# Patient Record
Sex: Female | Born: 1970 | Hispanic: Yes | Smoking: Never smoker
Health system: Southern US, Community
[De-identification: ages and names within clinical notes are randomized; demographics above are authoritative.]

## PROBLEM LIST (undated history)

## (undated) DIAGNOSIS — N760 Acute vaginitis: Secondary | ICD-10-CM

## (undated) DIAGNOSIS — R87629 Unspecified abnormal cytological findings in specimens from vagina: Secondary | ICD-10-CM

## (undated) DIAGNOSIS — I10 Essential (primary) hypertension: Secondary | ICD-10-CM

## (undated) DIAGNOSIS — E7800 Pure hypercholesterolemia, unspecified: Secondary | ICD-10-CM

## (undated) DIAGNOSIS — E039 Hypothyroidism, unspecified: Secondary | ICD-10-CM

## (undated) DIAGNOSIS — R112 Nausea with vomiting, unspecified: Secondary | ICD-10-CM

## (undated) DIAGNOSIS — B9689 Other specified bacterial agents as the cause of diseases classified elsewhere: Secondary | ICD-10-CM

## (undated) DIAGNOSIS — Z9889 Other specified postprocedural states: Secondary | ICD-10-CM

## (undated) DIAGNOSIS — E079 Disorder of thyroid, unspecified: Secondary | ICD-10-CM

## (undated) DIAGNOSIS — N72 Inflammatory disease of cervix uteri: Secondary | ICD-10-CM

## (undated) DIAGNOSIS — D241 Benign neoplasm of right breast: Secondary | ICD-10-CM

## (undated) HISTORY — DX: Other specified bacterial agents as the cause of diseases classified elsewhere: B96.89

## (undated) HISTORY — DX: Disorder of thyroid, unspecified: E07.9

## (undated) HISTORY — DX: Other specified bacterial agents as the cause of diseases classified elsewhere: N76.0

## (undated) HISTORY — DX: Benign neoplasm of right breast: D24.1

## (undated) HISTORY — DX: Inflammatory disease of cervix uteri: N72

## (undated) HISTORY — DX: Unspecified abnormal cytological findings in specimens from vagina: R87.629

## (undated) HISTORY — DX: Pure hypercholesterolemia, unspecified: E78.00

## (undated) HISTORY — DX: Essential (primary) hypertension: I10

---

## 2006-01-10 HISTORY — PX: BREAST BIOPSY: SHX20

## 2011-04-03 ENCOUNTER — Other Ambulatory Visit (HOSPITAL_COMMUNITY): Payer: Self-pay | Admitting: Family Medicine

## 2011-04-03 DIAGNOSIS — N6310 Unspecified lump in the right breast, unspecified quadrant: Secondary | ICD-10-CM

## 2011-04-25 ENCOUNTER — Encounter (HOSPITAL_COMMUNITY): Payer: Self-pay

## 2016-06-10 DIAGNOSIS — R1013 Epigastric pain: Secondary | ICD-10-CM | POA: Diagnosis not present

## 2016-06-10 DIAGNOSIS — F3289 Other specified depressive episodes: Secondary | ICD-10-CM | POA: Diagnosis not present

## 2016-06-10 DIAGNOSIS — F4321 Adjustment disorder with depressed mood: Secondary | ICD-10-CM | POA: Diagnosis not present

## 2016-06-10 DIAGNOSIS — K2900 Acute gastritis without bleeding: Secondary | ICD-10-CM | POA: Diagnosis not present

## 2016-07-23 DIAGNOSIS — K439 Ventral hernia without obstruction or gangrene: Secondary | ICD-10-CM | POA: Diagnosis not present

## 2016-09-21 DIAGNOSIS — N760 Acute vaginitis: Secondary | ICD-10-CM | POA: Diagnosis not present

## 2016-09-21 DIAGNOSIS — N72 Inflammatory disease of cervix uteri: Secondary | ICD-10-CM | POA: Diagnosis not present

## 2016-09-21 DIAGNOSIS — Z01419 Encounter for gynecological examination (general) (routine) without abnormal findings: Secondary | ICD-10-CM | POA: Diagnosis not present

## 2016-09-21 DIAGNOSIS — R87610 Atypical squamous cells of undetermined significance on cytologic smear of cervix (ASC-US): Secondary | ICD-10-CM | POA: Diagnosis not present

## 2016-09-21 DIAGNOSIS — Z3009 Encounter for other general counseling and advice on contraception: Secondary | ICD-10-CM | POA: Diagnosis not present

## 2016-09-26 DIAGNOSIS — E789 Disorder of lipoprotein metabolism, unspecified: Secondary | ICD-10-CM | POA: Diagnosis not present

## 2016-09-26 DIAGNOSIS — Z01419 Encounter for gynecological examination (general) (routine) without abnormal findings: Secondary | ICD-10-CM | POA: Diagnosis not present

## 2016-10-09 DIAGNOSIS — E039 Hypothyroidism, unspecified: Secondary | ICD-10-CM | POA: Diagnosis not present

## 2016-10-09 DIAGNOSIS — E785 Hyperlipidemia, unspecified: Secondary | ICD-10-CM | POA: Diagnosis not present

## 2016-10-19 DIAGNOSIS — Z1231 Encounter for screening mammogram for malignant neoplasm of breast: Secondary | ICD-10-CM | POA: Diagnosis not present

## 2016-11-26 DIAGNOSIS — E039 Hypothyroidism, unspecified: Secondary | ICD-10-CM | POA: Diagnosis not present

## 2016-12-07 HISTORY — PX: COLPOSCOPY: SHX161

## 2016-12-25 ENCOUNTER — Other Ambulatory Visit (HOSPITAL_COMMUNITY)
Admission: RE | Admit: 2016-12-25 | Discharge: 2016-12-25 | Disposition: A | Discharge status: Home / Self Care | Payer: BLUE CROSS/BLUE SHIELD | Source: Ambulatory Visit | Attending: Unknown Physician Specialty | Admitting: Unknown Physician Specialty

## 2016-12-25 DIAGNOSIS — R87610 Atypical squamous cells of undetermined significance on cytologic smear of cervix (ASC-US): Secondary | ICD-10-CM | POA: Diagnosis not present

## 2016-12-25 DIAGNOSIS — N871 Moderate cervical dysplasia: Secondary | ICD-10-CM | POA: Diagnosis not present

## 2016-12-25 DIAGNOSIS — N879 Dysplasia of cervix uteri, unspecified: Secondary | ICD-10-CM | POA: Diagnosis not present

## 2016-12-25 DIAGNOSIS — N72 Inflammatory disease of cervix uteri: Secondary | ICD-10-CM | POA: Diagnosis not present

## 2017-02-11 ENCOUNTER — Encounter: Payer: Self-pay | Admitting: *Deleted

## 2017-02-11 ENCOUNTER — Encounter: Payer: Self-pay | Admitting: Obstetrics and Gynecology

## 2017-02-11 ENCOUNTER — Ambulatory Visit (INDEPENDENT_AMBULATORY_CARE_PROVIDER_SITE_OTHER): Payer: BLUE CROSS/BLUE SHIELD | Admitting: Obstetrics and Gynecology

## 2017-02-11 DIAGNOSIS — Z3202 Encounter for pregnancy test, result negative: Secondary | ICD-10-CM

## 2017-02-11 DIAGNOSIS — N871 Moderate cervical dysplasia: Secondary | ICD-10-CM | POA: Diagnosis not present

## 2017-02-11 LAB — POCT URINE PREGNANCY: Preg Test, Ur: NEGATIVE

## 2017-02-11 NOTE — Progress Notes (Addendum)
   [facility] Visit  @DATE @            Patient name: Brandy Frank MRN [id number]  Date of birth: Nov 22, 1970  CC & HPI:  Brandy Frank is a 46 y.o. 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  ROS:  ROS   Pertinent History Reviewed:   Reviewed: Significant for  Medical         Past Medical History:  Diagnosis Date  . BV (bacterial vaginosis)   . Cervicitis   . Fibroadenoma of right breast   . High cholesterol   . Thyroid disease   . Vaginal Pap smear, abnormal                               Surgical Hx:    Past Surgical History:  Procedure Laterality Date  . BREAST BIOPSY Right 01/10/2006  . [place], 2002, 2003, 2009  . COLPOSCOPY  12/2016   Medications: Reviewed & Updated - see associated section                       Current Outpatient Prescriptions:  .  DiphenhydrAMINE HCl 50 MG/30ML LIQD, Take by mouth as needed. , Disp: , Rfl:  .  levothyroxine (SYNTHROID, LEVOTHROID) 25 MCG tablet, Take 25 mcg by mouth daily before breakfast. , Disp: , Rfl: 1 .  Vitamin D, Ergocalciferol, (DRISDOL) 50000 units CAPS capsule, Take 50,000 Units by mouth every 7 (seven) days. , Disp: , Rfl: 0   Social History: Reviewed -  reports that she has never smoked. She has never used smokeless tobacco.  Objective Findings:  Vitals: Blood pressure 120/70, pulse (!) 55, height 4\' 11"  (1.499 m), weight 128 lb 6.4 oz (58.2 kg), last menstrual period 02/03/2017.  Physical Examination: General appearance - alert, well appearing, and in no distress, oriented to person, place, and time and normal appearing weight Mental status - alert, oriented to person, place, and time, normal mood, behavior, speech, dress, motor activity, and thought processes Eyes - pupils equal and reactive, extraocular eye movements intact Chest - clear to auscultation, no wheezes, rales or rhonchi, symmetric air  entry Abdomen - soft, nontender, nondistended, no masses or organomegaly Pelvic - VULVA: normal appearing vulva with no masses, tenderness or lesions, VAGINA: normal appearing vagina with normal color and discharge, no lesions, light d/c at end of menses , CERVIX: multiparous os, deviated to pt left, adequate visibility, Colpo repeated , able to see Endocervix without difficulty, Wide area of white discoloration, will need wide shallow conization.   Extremities - peripheral pulses normal, no pedal edema, no clubbing or cyanosis   Assessment & Plan:   A:  1. CIN II wide area.   P:  1. Plan For Conization. 2 Will schedule and contact pt or her authorized contact, her daughter.

## 2017-02-25 ENCOUNTER — Telehealth: Payer: Self-pay | Admitting: Obstetrics and Gynecology

## 2017-02-25 DIAGNOSIS — E559 Vitamin D deficiency, unspecified: Secondary | ICD-10-CM | POA: Diagnosis not present

## 2017-02-25 DIAGNOSIS — E785 Hyperlipidemia, unspecified: Secondary | ICD-10-CM | POA: Diagnosis not present

## 2017-03-02 NOTE — Telephone Encounter (Signed)
Patient needs a CKC.

## 2017-03-18 ENCOUNTER — Other Ambulatory Visit: Payer: Self-pay | Admitting: Obstetrics and Gynecology

## 2017-03-18 ENCOUNTER — Encounter: Payer: Self-pay | Admitting: Obstetrics and Gynecology

## 2017-03-18 ENCOUNTER — Ambulatory Visit (INDEPENDENT_AMBULATORY_CARE_PROVIDER_SITE_OTHER): Payer: BLUE CROSS/BLUE SHIELD | Admitting: Obstetrics and Gynecology

## 2017-03-18 VITALS — BP 110/80 | HR 80 | Ht <= 58 in | Wt 127.4 lb

## 2017-03-18 DIAGNOSIS — N871 Moderate cervical dysplasia: Secondary | ICD-10-CM | POA: Diagnosis not present

## 2017-03-18 DIAGNOSIS — Z01818 Encounter for other preprocedural examination: Secondary | ICD-10-CM

## 2017-03-18 NOTE — Progress Notes (Signed)
Patient ID: Brandy Frank, female   DOB: 27-Apr-1971, 46 y.o.   MRN: [id number] Preoperative History and Physical  Brandy Frank is a 46 y.o. [id number] here for surgical management of  an abnormal pap smear.  She is referred from the health department for CIN-2 cervical biopsies and endocervical sample   No significant preoperative concerns. Pt reports constant bloating that is worse in the morning. It will go away on itself. Each episode will last for ~ 10 minutes each time. Pain is not increased or decreased with BM.   Proposed surgery: Cold knife conization of cervix  Past Medical History:  Diagnosis Date  . BV (bacterial vaginosis)   . Cervicitis   . Fibroadenoma of right breast   . High cholesterol   . Thyroid disease   . Vaginal Pap smear, abnormal    Past Surgical History:  Procedure Laterality Date  . BREAST BIOPSY Right 01/10/2006  . [place], 2002, 2003, 2009  . COLPOSCOPY  12/2016    Patient is a gravida 5 para 5, status post 4 cesarean sections. She complains of lower pelvic discomfort on bimanual exam states this is been ongoing for the last few months OB History  Gravida Para Term Preterm AB Living  5 5 5     5   SAB TAB Ectopic Multiple Live Births          5    # Outcome Date GA Lbr Len/2nd Weight Sex Delivery Anes PTL Lv  5 Term     F CS-LTranv   LIV  4 Term     M CS-LTranv   LIV  3 Term     F CS-LTranv   LIV  2 Term     M CS-LTranv   LIV  1 Term     F Vag-Spont   LIV    Patient denies any other pertinent gynecologic issues.   Current Outpatient Prescriptions on File Prior to Visit  Medication Sig Dispense Refill  . levothyroxine (SYNTHROID, LEVOTHROID) 25 MCG tablet Take 25 mcg by mouth daily before breakfast.   1  . DiphenhydrAMINE HCl 50 MG/30ML LIQD Take by mouth as needed.     . Vitamin D, Ergocalciferol, (DRISDOL) 50000 units CAPS capsule Take 50,000 Units by mouth every 7 (seven) days.   0   No current  facility-administered medications on file prior to visit.    No Known Allergies  Social History:   reports that she has never smoked. She has never used smokeless tobacco. She reports that she does not drink alcohol or use drugs.  Family History  Problem Relation Age of Onset  . Cancer Paternal Grandfather     Review of Systems: Noncontributory  PHYSICAL EXAM: Blood pressure 110/80, pulse 80, height 4' 1.32" (1.253 m), weight 127 lb 6.4 oz (57.8 kg), last menstrual period 03/05/2017. General appearance - alert, well appearing, and in no distress Chest - clear to auscultation, no wheezes, rales or rhonchi, symmetric air entry Heart - normal rate and regular rhythm Abdomen - soft, nontender, nondistended, no masses or organomegaly Pelvic - examination  External: normal Vagina: multiparous Cervix: well supported, slightly deviated to the left Uterus: anterior, slight decreased mobility, Scar tissue? Tender on bimanual exam to the left of the midline Adnexa: mobile, non tender Extremities - peripheral pulses normal, no pedal edema, no clubbing or cyanosis  Labs: No results found for this or any previous visit (from the past 336 hour(s)).  Imaging Studies:  No results found.  Assessment: Patient Active Problem List   Diagnosis Date Noted  . Moderate dysplasia of cervix (CIN II) 02/11/2017    Plan: Patient will undergo surgical management with Conization Cervix with Biopsy (cold knife).   Surgery is scheduled for 25 September 7:30 AM .mec 03/18/2017 3:01 PM  By signing my name below, I, Margit Banda, attest that this documentation has been prepared under the direction and in the presence of [name], MD. Electronically Signed: Margit Banda, Medical Scribe. 03/18/17. 3:02 PM.  I personally performed the services described in this documentation, which was SCRIBED in my presence. The recorded information has been reviewed and considered accurate. It has been edited  as necessary during review. [name], MD

## 2017-03-19 DIAGNOSIS — Z01818 Encounter for other preprocedural examination: Secondary | ICD-10-CM | POA: Diagnosis not present

## 2017-03-20 LAB — GC/CHLAMYDIA PROBE AMP
CHLAMYDIA, DNA PROBE: NEGATIVE
NEISSERIA GONORRHOEAE BY PCR: NEGATIVE

## 2017-03-21 NOTE — Patient Instructions (Signed)
Brandy Frank  03/21/2017     @PREFPERIOPPHARMACY @   Your procedure is scheduled on 04/02/2017.  Report to [facility] at 6:15 A.M.  Call this number if you have problems the morning of surgery:  [phone]   Remember:  Do not eat food or drink liquids after midnight.  Take these medicines the morning of surgery with A SIP OF WATER Synthroid   Do not wear jewelry, make-up or nail polish.  Do not wear lotions, powders, or perfumes, or deoderant.  Do not shave 48 hours prior to surgery.  Men may shave face and neck.  Do not bring valuables to the hospital.  [facility] is not responsible for any belongings or valuables.  Contacts, dentures or bridgework may not be worn into surgery.  Leave your suitcase in the car.  After surgery it may be brought to your room.  For patients admitted to the hospital, discharge time will be determined by your treatment team.  Patients discharged the day of surgery will not be allowed to drive home.   Please read over the following fact sheets that you were given. Surgical Site Infection Prevention and Anesthesia Post-op Instructions     PATIENT INSTRUCTIONS POST-ANESTHESIA  IMMEDIATELY FOLLOWING SURGERY:  Do not drive or operate machinery for the first twenty four hours after surgery.  Do not make any important decisions for twenty four hours after surgery or while taking narcotic pain medications or sedatives.  If you develop intractable nausea and vomiting or a severe headache please notify your doctor immediately.  FOLLOW-UP:  Please make an appointment with your surgeon as instructed. You do not need to follow up with anesthesia unless specifically instructed to do so.  WOUND CARE INSTRUCTIONS (if applicable):  Keep a dry clean dressing on the anesthesia/puncture wound site if there is drainage.  Once the wound has quit draining you may leave it open to air.  Generally you should leave the bandage intact for twenty four hours  unless there is drainage.  If the epidural site drains for more than 36-48 hours please call the anesthesia department.  QUESTIONS?:  Please feel free to call your physician or the hospital operator if you have any questions, and they will be happy to assist you.      Cervical Conization Cervical conization (cone biopsy) is a procedure in which a cone-shaped portion of the cervix is cut out so that it can be examined under a microscope. The procedure is done to check for cancer cells or cells that might turn into cancer (precancerous cells). You may have this procedure if:  You have abnormal bleeding from your cervix.  You had an abnormal Pap test.  Something abnormal was seen on your cervix during an exam.  This procedure is performed in either a health care provider's office or in an operating room. Tell a health care provider about:  Any allergies you have.  All medicines you are taking, including vitamins, herbs, eye drops, creams, and over-the-counter medicines.  Any problems you or family members have had with the use of anesthetic medicines.  Any blood disorders you have.  Any surgeries you have had.  Any medical conditions you have.  Your smoking habits.  When you normally have your period.  Whether you are pregnant or may be pregnant. What are the risks? Generally, this is a safe procedure. However, problems may occur, including:  Heavy bleeding for several days or weeks after the procedure.  Allergic reactions to medicines or  dyes.  Increased risk of preterm labor in future pregnancies.  Infection (rare).  Damage to the cervix or other structures or organs (rare).  What happens before the procedure? Staying hydrated Follow instructions from your health care provider about hydration, which may include:  Up to 2 hours before the procedure - you may continue to drink clear liquids, such as water, clear fruit juice, black coffee, and plain tea.  Eating and  drinking restrictions Follow instructions from your health care provider about eating and drinking, which may include:  8 hours before the procedure - stop eating heavy meals or foods such as meat, fried foods, or fatty foods.  6 hours before the procedure - stop eating light meals or foods, such as toast or cereal.  6 hours before the procedure - stop drinking milk or drinks that contain milk.  2 hours before the procedure - stop drinking clear liquids.  General instructions  Do not douche, have sex, use tampons, or use any vaginal medicines before the procedure as told by your health care provider.  You may be asked to empty your bladder and bowel right before the procedure.  Ask your health care provider about: ? Changing or stopping your normal medicines. This is important if you take diabetes medicines or blood thinners. ? Taking medicines such as aspirin and ibuprofen. These medicines can thin your blood. Do not take these medicines before your procedure if your doctor tells you not to.  Plan to have someone take you home from the hospital or clinic. What happens during the procedure?  To reduce your risk of infection: ? Your health care team will wash or sanitize their hands. ? Your skin will be washed with soap. ? Hair may be removed from the surgical area.  You will undress from the waist down and be given a gown to wear.  You will lie on an examining table and put your feet in stirrups.  An IV tube will be inserted into one of your veins.  You will be given one or more of the following: ? A medicine to help you relax (sedative). ? A medicine to numb the area (local anesthetic). ? A medicine to make you fall asleep (general anesthetic). ? A medicine that numbs the cervix (cervical block).  A lubricated device called a speculum will be inserted into your vagina. It will be used to spread open the walls of the vagina so your health care provider can see the inside of  the vagina and cervix better.  An instrument that has a magnifying lens and a light (colposcope) will let your health care provider examine the cervix more closely.  Your health care provider will apply a solution to your cervix. This turns abnormal areas a pale color.  A tissue sample will be removed from the cervix using one of the following methods: ? The cold knife method. In this method, the tissue is cut out with a knife (scalpel). ? The loop electrosurgical excision procedure (LEEP) method. In this method, the tissue is cut out with a thin wire that can burn (cauterize) the tissue with an electrical current. ? Laser treatment method. In this method, the tissue is cut out and then cauterized with a laser beam to prevent bleeding.  Your health care provider will apply a paste over the biopsy areas to help control bleeding.  The tissue sample will be examined under a microscope. The procedure may vary among health care providers and hospitals. What happens after  the procedure?  Your blood pressure, heart rate, breathing rate, and blood oxygen level will be monitored often until the medicines you were given have worn off.  If you were given a local anesthetic, you will rest at the clinic or hospital until you are stable and feel ready to go home.  If you were given a general anesthetic, you may be monitored for a longer period of time.  You may have some cramping.  You may have bloody discharge or light to moderate bleeding.  You may have dark discharge coming from your vagina. This is from the paste used on the cervix to prevent bleeding. Summary  Cervical conization is a procedure in which a cone-shaped portion of the cervix is cut out so that it can be examined under a microscope.  The procedure is done to check for cancer cells or cells that might turn into cancer (precancerous cells). This information is not intended to replace advice given to you by your health care  provider. Make sure you discuss any questions you have with your health care provider. Document Released: 04/04/2005 Document Revised: 06/27/2016 Document Reviewed: 06/27/2016 Elsevier Interactive Patient Education  2017 [employer].

## 2017-03-27 ENCOUNTER — Encounter (HOSPITAL_COMMUNITY): Payer: Self-pay

## 2017-03-27 ENCOUNTER — Encounter (HOSPITAL_COMMUNITY)
Admission: RE | Admit: 2017-03-27 | Discharge: 2017-03-27 | Disposition: A | Discharge status: Home / Self Care | Payer: BLUE CROSS/BLUE SHIELD | Source: Ambulatory Visit | Attending: Obstetrics and Gynecology | Admitting: Obstetrics and Gynecology

## 2017-03-27 DIAGNOSIS — Z9889 Other specified postprocedural states: Secondary | ICD-10-CM | POA: Diagnosis not present

## 2017-03-27 DIAGNOSIS — N871 Moderate cervical dysplasia: Secondary | ICD-10-CM | POA: Diagnosis not present

## 2017-03-27 DIAGNOSIS — Z809 Family history of malignant neoplasm, unspecified: Secondary | ICD-10-CM | POA: Diagnosis not present

## 2017-03-27 DIAGNOSIS — Z01812 Encounter for preprocedural laboratory examination: Secondary | ICD-10-CM | POA: Insufficient documentation

## 2017-03-27 DIAGNOSIS — Z79899 Other long term (current) drug therapy: Secondary | ICD-10-CM | POA: Insufficient documentation

## 2017-03-27 HISTORY — DX: Hypothyroidism, unspecified: E03.9

## 2017-03-27 LAB — CBC
HCT: 38.8 % (ref 36.0–46.0)
HEMOGLOBIN: 13.1 g/dL (ref 12.0–15.0)
MCH: 29.3 pg (ref 26.0–34.0)
MCHC: 33.8 g/dL (ref 30.0–36.0)
MCV: 86.8 fL (ref 78.0–100.0)
PLATELETS: 191 10*3/uL (ref 150–400)
RBC: 4.47 MIL/uL (ref 3.87–5.11)
RDW: 13.5 % (ref 11.5–15.5)
WBC: 6.8 10*3/uL (ref 4.0–10.5)

## 2017-03-27 LAB — COMPREHENSIVE METABOLIC PANEL
ALBUMIN: 4.8 g/dL (ref 3.5–5.0)
ALK PHOS: 49 U/L (ref 38–126)
ALT: 14 U/L (ref 14–54)
ANION GAP: 9 (ref 5–15)
AST: 18 U/L (ref 15–41)
BUN: 11 mg/dL (ref 6–20)
CALCIUM: 9.4 mg/dL (ref 8.9–10.3)
CHLORIDE: 104 mmol/L (ref 101–111)
CO2: 23 mmol/L (ref 22–32)
CREATININE: 0.72 mg/dL (ref 0.44–1.00)
GFR calc non Af Amer: >60 mL/min (ref >60)
GLUCOSE: 90 mg/dL (ref 65–99)
Potassium: 4 mmol/L (ref 3.5–5.1)
SODIUM: 136 mmol/L (ref 135–145)
Total Bilirubin: 1 mg/dL (ref 0.3–1.2)
Total Protein: 7.9 g/dL (ref 6.5–8.1)

## 2017-03-27 LAB — HCG, SERUM, QUALITATIVE: Preg, Serum: NEGATIVE

## 2017-04-01 NOTE — H&P (Signed)
Patient ID: Brandy Frank, female   DOB: [birth year], 46 y.o.   MRN: [id number] Preoperative History and Physical  Brandy Frank is a 46 y.o. [id number] here for surgical management of  an abnormal pap smear.  She is referred from the health department for CIN-2 cervical biopsies and endocervical sample   No significant preoperative concerns. Pt reports constant bloating that is worse in the morning. It will go away on itself. Each episode will last for ~ 10 minutes each time. Pain is not increased or decreased with BM.   Proposed surgery: Cold knife conization of cervix      Past Medical History:  Diagnosis Date  . BV (bacterial vaginosis)   . Cervicitis   . Fibroadenoma of right breast   . High cholesterol   . Thyroid disease   . Vaginal Pap smear, abnormal         Past Surgical History:  Procedure Laterality Date  . BREAST BIOPSY Right 01/10/2006  . [place], 2002, 2003, 2009  . COLPOSCOPY  12/2016    Patient is a gravida 5 para 5, status post 4 cesarean sections. She complains of lower pelvic discomfort on bimanual exam states this is been ongoing for the last few months                 OB History  Gravida Para Term Preterm AB Living  5 5 5     5   SAB TAB Ectopic Multiple Live Births          5    # Outcome Date GA Lbr Len/2nd Weight Sex Delivery Anes PTL Lv  5 Term     F CS-LTranv   LIV  4 Term     M CS-LTranv   LIV  3 Term     F CS-LTranv   LIV  2 Term     M CS-LTranv   LIV  1 Term     F Vag-Spont   LIV    Patient denies any other pertinent gynecologic issues.         Current Outpatient Prescriptions on File Prior to Visit  Medication Sig Dispense Refill  . levothyroxine (SYNTHROID, LEVOTHROID) 25 MCG tablet Take 25 mcg by mouth daily before breakfast.   1  . DiphenhydrAMINE HCl 50 MG/30ML LIQD Take by mouth as needed.     . Vitamin D, Ergocalciferol, (DRISDOL) 50000  units CAPS capsule Take 50,000 Units by mouth every 7 (seven) days.   0   No current facility-administered medications on file prior to visit.    No Known Allergies  Social History:   reports that she has never smoked. She has never used smokeless tobacco. She reports that she does not drink alcohol or use drugs.       Family History  Problem Relation Age of Onset  . Cancer Paternal Grandfather     Review of Systems: Noncontributory  PHYSICAL EXAM: Blood pressure 110/80, pulse 80, height 4' 1.32" (1.253 m), weight 127 lb 6.4 oz (57.8 kg), last menstrual period 03/05/2017. General appearance - alert, well appearing, and in no distress Chest - clear to auscultation, no wheezes, rales or rhonchi, symmetric air entry Heart - normal rate and regular rhythm Abdomen - soft, nontender, nondistended, no masses or organomegaly Pelvic - examination  External: normal Vagina: multiparous Cervix: well supported, slightly deviated to the left Uterus: anterior, slight decreased mobility, Scar tissue? Tender on bimanual exam to the left of the  midline Adnexa: mobile, non tender Extremities - peripheral pulses normal, no pedal edema, no clubbing or cyanosis  Labs: CBC    Component Value Date/Time   WBC 6.8 03/27/2017 1141   RBC 4.47 03/27/2017 1141   HGB 13.1 03/27/2017 1141   HCT 38.8 03/27/2017 1141   PLT 191 03/27/2017 1141   MCV 86.8 03/27/2017 1141   MCH 29.3 03/27/2017 1141   MCHC 33.8 03/27/2017 1141   RDW 13.5 03/27/2017 1141   CMP Latest Ref Rng & Units 03/27/2017  Glucose 65 - 99 mg/dL 90  BUN 6 - 20 mg/dL 11  Creatinine 0.44 - 1.00 mg/dL 0.72  Sodium 135 - 145 mmol/L 136  Potassium 3.5 - 5.1 mmol/L 4.0  Chloride 101 - 111 mmol/L 104  CO2 22 - 32 mmol/L 23  Calcium 8.9 - 10.3 mg/dL 9.4  Total Protein 6.5 - 8.1 g/dL 7.9  Total Bilirubin 0.3 - 1.2 mg/dL 1.0  Alkaline Phos 38 - 126 U/L 49  AST 15 - 41 U/L 18  ALT 14 - 54 U/L 14      Imaging  Studies: ImagingResults  No results found.    Assessment:     Patient Active Problem List   Diagnosis Date Noted  . Moderate dysplasia of cervix (CIN II) 02/11/2017    Plan: Patient will undergo surgical management with Conization Cervix with Biopsy (cold knife).   Surgery is scheduled for 25 September 7:30 AM .mec 03/18/2017 3:01 PM  By signing my name below, I, Margit Banda, attest that this documentation has been prepared under the direction and in the presence of [name], MD. Electronically Signed: Margit Banda, Medical Scribe. 03/18/17. 3:02 PM.  I personally performed the services described in this documentation, which was SCRIBED in my presence. The recorded information has been reviewed and considered accurate. It has been edited as necessary during review. [name], MD

## 2017-04-02 ENCOUNTER — Ambulatory Visit (HOSPITAL_COMMUNITY)
Admission: RE | Admit: 2017-04-02 | Discharge: 2017-04-02 | Disposition: A | Discharge status: Home / Self Care | Payer: BLUE CROSS/BLUE SHIELD | Source: Ambulatory Visit | Attending: Obstetrics and Gynecology | Admitting: Obstetrics and Gynecology

## 2017-04-02 ENCOUNTER — Encounter (HOSPITAL_COMMUNITY): Payer: Self-pay | Admitting: *Deleted

## 2017-04-02 ENCOUNTER — Encounter: Payer: Self-pay | Admitting: Obstetrics and Gynecology

## 2017-04-02 ENCOUNTER — Encounter (HOSPITAL_COMMUNITY)
Admission: RE | Disposition: A | Discharge status: Home / Self Care | Payer: Self-pay | Source: Ambulatory Visit | Attending: Obstetrics and Gynecology

## 2017-04-02 ENCOUNTER — Ambulatory Visit (HOSPITAL_COMMUNITY): Payer: BLUE CROSS/BLUE SHIELD | Admitting: Anesthesiology

## 2017-04-02 DIAGNOSIS — Z79899 Other long term (current) drug therapy: Secondary | ICD-10-CM | POA: Diagnosis not present

## 2017-04-02 DIAGNOSIS — N879 Dysplasia of cervix uteri, unspecified: Secondary | ICD-10-CM | POA: Diagnosis not present

## 2017-04-02 DIAGNOSIS — E039 Hypothyroidism, unspecified: Secondary | ICD-10-CM | POA: Insufficient documentation

## 2017-04-02 DIAGNOSIS — N871 Moderate cervical dysplasia: Secondary | ICD-10-CM | POA: Insufficient documentation

## 2017-04-02 DIAGNOSIS — N72 Inflammatory disease of cervix uteri: Secondary | ICD-10-CM | POA: Insufficient documentation

## 2017-04-02 DIAGNOSIS — E7800 Pure hypercholesterolemia, unspecified: Secondary | ICD-10-CM | POA: Diagnosis not present

## 2017-04-02 HISTORY — DX: Other specified postprocedural states: Z98.890

## 2017-04-02 HISTORY — DX: Nausea with vomiting, unspecified: R11.2

## 2017-04-02 HISTORY — PX: CERVICAL CONIZATION W/BX: SHX1330

## 2017-04-02 SURGERY — CONE BIOPSY, CERVIX
Anesthesia: General

## 2017-04-02 MED ORDER — FENTANYL CITRATE (PF) 100 MCG/2ML IJ SOLN
INTRAMUSCULAR | Status: AC
  Filled 2017-04-02: qty 4

## 2017-04-02 MED ORDER — TRAMADOL HCL 50 MG PO TABS: 50.0000 mg | ORAL_TABLET | Freq: Four times a day (QID) | ORAL | 0 refills | Status: DC | PRN

## 2017-04-02 MED ORDER — MIDAZOLAM HCL 2 MG/2ML IJ SOLN
1.0000 mg | INTRAMUSCULAR | Status: AC
  Administered 2017-04-02: 2 mg via INTRAVENOUS

## 2017-04-02 MED ORDER — EPHEDRINE SULFATE 50 MG/ML IJ SOLN
INTRAMUSCULAR | Status: AC
  Filled 2017-04-02: qty 1

## 2017-04-02 MED ORDER — ONDANSETRON HCL 4 MG/2ML IJ SOLN
INTRAMUSCULAR | Status: AC
  Filled 2017-04-02: qty 2

## 2017-04-02 MED ORDER — IODINE STRONG (LUGOLS) 5 % PO SOLN
ORAL | Status: AC
  Filled 2017-04-02: qty 1

## 2017-04-02 MED ORDER — PROPOFOL 10 MG/ML IV BOLUS
INTRAVENOUS | Status: DC | PRN
  Administered 2017-04-02: 130 mg via INTRAVENOUS

## 2017-04-02 MED ORDER — BUPIVACAINE-EPINEPHRINE 0.5% -1:200000 IJ SOLN
INTRAMUSCULAR | Status: DC | PRN
  Administered 2017-04-02: 10 mL

## 2017-04-02 MED ORDER — BUPIVACAINE-EPINEPHRINE (PF) 0.5% -1:200000 IJ SOLN
INTRAMUSCULAR | Status: AC
  Filled 2017-04-02: qty 30

## 2017-04-02 MED ORDER — SUCCINYLCHOLINE CHLORIDE 20 MG/ML IJ SOLN
INTRAMUSCULAR | Status: AC
  Filled 2017-04-02: qty 1

## 2017-04-02 MED ORDER — IBUPROFEN 600 MG PO TABS: 600.0000 mg | ORAL_TABLET | Freq: Four times a day (QID) | ORAL | 1 refills | Status: DC | PRN

## 2017-04-02 MED ORDER — ONDANSETRON HCL 4 MG/2ML IJ SOLN
4.0000 mg | Freq: Once | INTRAMUSCULAR | Status: AC
  Administered 2017-04-02: 4 mg via INTRAVENOUS

## 2017-04-02 MED ORDER — MIDAZOLAM HCL 5 MG/5ML IJ SOLN
INTRAMUSCULAR | Status: DC | PRN
  Administered 2017-04-02: 2 mg via INTRAVENOUS

## 2017-04-02 MED ORDER — LACTATED RINGERS IV SOLN
INTRAVENOUS | Status: DC
  Administered 2017-04-02: 07:00:00 via INTRAVENOUS

## 2017-04-02 MED ORDER — LIDOCAINE HCL 1 % IJ SOLN
INTRAMUSCULAR | Status: DC | PRN
  Administered 2017-04-02: 25 mg via INTRADERMAL

## 2017-04-02 MED ORDER — DEXAMETHASONE SODIUM PHOSPHATE 4 MG/ML IJ SOLN
4.0000 mg | Freq: Once | INTRAMUSCULAR | Status: AC
  Administered 2017-04-02: 4 mg via INTRAVENOUS

## 2017-04-02 MED ORDER — MIDAZOLAM HCL 2 MG/2ML IJ SOLN
INTRAMUSCULAR | Status: AC
  Filled 2017-04-02: qty 2

## 2017-04-02 MED ORDER — FERRIC SUBSULFATE 259 MG/GM EX SOLN
CUTANEOUS | Status: AC
  Filled 2017-04-02: qty 8

## 2017-04-02 MED ORDER — IODINE STRONG (LUGOLS) 5 % PO SOLN
ORAL | Status: DC | PRN
  Administered 2017-04-02: 14 mL via ORAL

## 2017-04-02 MED ORDER — SODIUM CHLORIDE 0.9 % IJ SOLN
INTRAMUSCULAR | Status: AC
  Filled 2017-04-02: qty 10

## 2017-04-02 MED ORDER — FENTANYL CITRATE (PF) 100 MCG/2ML IJ SOLN
INTRAMUSCULAR | Status: DC | PRN
  Administered 2017-04-02 (×2): 25 ug via INTRAVENOUS

## 2017-04-02 MED ORDER — FERRIC SUBSULFATE 259 MG/GM EX SOLN
CUTANEOUS | Status: DC | PRN
  Administered 2017-04-02: 3

## 2017-04-02 MED ORDER — LIDOCAINE HCL (PF) 1 % IJ SOLN
INTRAMUSCULAR | Status: AC
  Filled 2017-04-02: qty 5

## 2017-04-02 MED ORDER — PROPOFOL 10 MG/ML IV BOLUS
INTRAVENOUS | Status: AC
  Filled 2017-04-02: qty 20

## 2017-04-02 MED ORDER — FENTANYL CITRATE (PF) 100 MCG/2ML IJ SOLN: 25.0000 ug | INTRAMUSCULAR | Status: DC | PRN

## 2017-04-02 MED ORDER — DEXAMETHASONE SODIUM PHOSPHATE 4 MG/ML IJ SOLN
INTRAMUSCULAR | Status: AC
  Filled 2017-04-02: qty 1

## 2017-04-02 SURGICAL SUPPLY — 31 items
BAG HAMPER (MISCELLANEOUS) ×2 IMPLANT
BLADE SURG 15 STRL LF DISP TIS (BLADE) ×1 IMPLANT
BLADE SURG 15 STRL SS (BLADE) ×1
CATH ROBINSON RED A/P 16FR (CATHETERS) IMPLANT
CLOTH BEACON ORANGE TIMEOUT ST (SAFETY) ×2 IMPLANT
COVER LIGHT HANDLE STERIS (MISCELLANEOUS) ×4 IMPLANT
DECANTER SPIKE VIAL GLASS SM (MISCELLANEOUS) ×2 IMPLANT
DRAPE HALF SHEET 40X57 (DRAPES) ×4 IMPLANT
DRAPE PROXIMA HALF (DRAPES) ×2 IMPLANT
ELECT REM PT RETURN 9FT ADLT (ELECTROSURGICAL) ×2
ELECTRODE REM PT RTRN 9FT ADLT (ELECTROSURGICAL) ×1 IMPLANT
FORMALIN 10 PREFIL 120ML (MISCELLANEOUS) ×2 IMPLANT
GLOVE BIOGEL PI IND STRL 7.0 (GLOVE) ×1 IMPLANT
GLOVE BIOGEL PI IND STRL 7.5 (GLOVE) ×1 IMPLANT
GLOVE BIOGEL PI IND STRL 9 (GLOVE) ×1 IMPLANT
GLOVE BIOGEL PI INDICATOR 7.0 (GLOVE) ×1
GLOVE BIOGEL PI INDICATOR 7.5 (GLOVE) ×1
GLOVE BIOGEL PI INDICATOR 9 (GLOVE) ×1
GLOVE ECLIPSE 6.5 STRL STRAW (GLOVE) ×2 IMPLANT
GLOVE ECLIPSE 9.0 STRL (GLOVE) ×2 IMPLANT
GOWN SPEC L3 XXLG W/TWL (GOWN DISPOSABLE) ×2 IMPLANT
GOWN STRL REUS W/TWL LRG LVL3 (GOWN DISPOSABLE) ×2 IMPLANT
KIT ROOM TURNOVER AP CYSTO (KITS) ×2 IMPLANT
MANIFOLD NEPTUNE II (INSTRUMENTS) ×2 IMPLANT
PACK PERI GYN (CUSTOM PROCEDURE TRAY) ×2 IMPLANT
PAD ARMBOARD 7.5X6 YLW CONV (MISCELLANEOUS) ×2 IMPLANT
SCOPETTES 8  STERILE (MISCELLANEOUS) ×1
SCOPETTES 8 STERILE (MISCELLANEOUS) ×1 IMPLANT
SET BASIN LINEN APH (SET/KITS/TRAYS/PACK) ×2 IMPLANT
SUT CHROMIC 2 0 CT 1 (SUTURE) ×6 IMPLANT
SYR CONTROL 10ML LL (SYRINGE) ×2 IMPLANT

## 2017-04-02 NOTE — Brief Op Note (Signed)
04/02/2017  9:05 AM  PATIENT:  Brandy Frank  46 y.o. 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  PROCEDURE:  Procedure(s): CONIZATION CERVIX WITH BIOPSY(cold knife) (N/A)  SURGEON:  Surgeon(s) and Role:    [name], MD - Primary  PHYSICIAN ASSISTANT:   ASSISTANTS: none   ANESTHESIA:   paracervical block General  EBL:  Total I/O In: 700 [I.V.:700] Out: 10 [Blood:10]  BLOOD ADMINISTERED:none  DRAINS: none   LOCAL MEDICATIONS USED:  MARCAINE    and Amount: 10 ml  SPECIMEN:  Source of Specimen:  Cervical cone  DISPOSITION OF SPECIMEN:  PATHOLOGY  COUNTS:  YES  TOURNIQUET:  * No tourniquets in log *  DICTATION: .Dragon Dictation  PLAN OF CARE: Discharge to home after PACU  PATIENT DISPOSITION:  PACU - hemodynamically stable.   Delay start of Pharmacological VTE agent (>24hrs) due to surgical blood loss or risk of bleeding: not applicable Details of procedure: Patient was taken operating room prepped and draped for vaginal procedure with timeout conducted. Vagina been prepped with Betadine. Lugol's solution was applied to the cervix identifying the ectocervical margin of the transition zone. Parous cervical block was applied using 10 cc of Marcaine with epinephrine, with intracervical injection as well. A stitch was placed at 3:00 and 9:00, 2-0 chromic to catch the cervical branches of the uterine artery as they enter the lower cervix. Were tagged for identification and traction. The conization specimen was then begun using a #15 blade and a cut made encircling the transition zone with a small clear margin of normal tissue. This was cut to a depth of approximately 5 mm and then was clamps used on the edge of the cone specimen gently attempting traction, to then cut out a cone-shaped specimen that extended  approximately 1 cm up the endocervical canal. Scissors were used to amputate the upper margin of the specimen. A suture was placed at 12:00 for orientation. Specimen was removed and the cervix inspected point cautery was used briefly on a couple spots to maximize hemostasis and then Monsel solution applied to the cervix to continue coagulation efforts There was no bleeding at this point and so the speculum was removed, the 3:00 and 9:00 sutures left long approximately 1.5 cm in length access and the patient was then allowed go recovery room in stable condition. Sponge and needle counts correct.

## 2017-04-02 NOTE — Anesthesia Preprocedure Evaluation (Signed)
Anesthesia Evaluation  Patient identified by MRN, date of birth, ID band Patient awake    Reviewed: Allergy & Precautions, NPO status , Patient's Chart, lab work & pertinent test results  History of Anesthesia Complications (+) PONV  Airway Mallampati: II  TM Distance: >3 FB     Dental  (+) Teeth Intact, Implants,    Pulmonary neg pulmonary ROS,    breath sounds clear to auscultation       Cardiovascular negative cardio ROS   Rhythm:Regular Rate:Normal     Neuro/Psych negative neurological ROS  negative psych ROS   GI/Hepatic negative GI ROS, Neg liver ROS,   Endo/Other  Hypothyroidism   Renal/GU      Musculoskeletal   Abdominal   Peds  Hematology   Anesthesia Other Findings   Reproductive/Obstetrics                             Anesthesia Physical Anesthesia Plan  ASA: II  Anesthesia Plan: General   Post-op Pain Management:    Induction: Intravenous  PONV Risk Score and Plan:   Airway Management Planned: LMA  Additional Equipment:   Intra-op Plan:   Post-operative Plan: Extubation in OR  Informed Consent: I have reviewed the patients History and Physical, chart, labs and discussed the procedure including the risks, benefits and alternatives for the proposed anesthesia with the patient or authorized representative who has indicated his/her understanding and acceptance.     Plan Discussed with:   Anesthesia Plan Comments:         Anesthesia Quick Evaluation

## 2017-04-02 NOTE — Discharge Instructions (Signed)
Conizacin del cuello del tero, cuidados posteriores (Conization of the Cervix, Care After) Estas indicaciones le proporcionan informacin acerca de cmo deber cuidarse despus del procedimiento. El mdico tambin podr darle instrucciones especficas. Comunquese con el mdico si tiene algn problema o tiene preguntas despus del procedimiento. CUIDADOS EN EL HOGAR  Planifique para que alguien la lleve de vuelta a su casa despus del procedimiento.  Solo tome los [employer] le haya indicado su mdico. No tome aspirina. Puede ocasionar hemorragias.  Durante la primera semana tome duchas. No tome baos, no practique natacin ni use el jacuzzi hasta que el mdico la autorice.  No se haga duchas vaginales, no utilice tampones ni tenga sexo (relaciones sexuales) hasta que el mdico la autorice.  Durante 7 a [address] evite: ? El exceso de [place]. ? Practicar actividad fsica. ? Levantar peso excesivo.  Vuelva a su dieta habitual excepto que el mdico le indique otra cosa.  Si no puede mover el intestino (est estreida), puede: ? Tomar un laxante suave segn las indicaciones del mdico. ? Agregar frutas y salvado a su dieta. ? Asegurarse de ingerir gran cantidad de lquido para mantener el pis (orina) de tono claro o color amarillo plido.  Cumpla con las visitas de control segn le indique su mdico.  SOLICITE AYUDA SI:  Tiene una erupcin cutnea.  Se siente mareada o sufre un desmayo.  Siente malestar estomacal (nuseas).  Tiene una secrecin vaginal con mal olor.  SOLICITE AYUDA DE INMEDIATO SI:  Tiene cogulos de sangre o una hemorragia ms abundante que un perodo menstrual normal. Por ejemplo, empapa un apsito en menos de 1 hora.  Tiene un sangrado rojo brillante.  Tiene fiebre de ms de 101F (38,3C) o sntomas persistentes durante ms de 2 o 3das.  Tiene fiebre de ms de 101F (38,3C) y los sntomas empeoran repentinamente.  Siente cada vez ms  clicos.  Pierde el conocimiento (se desmaya).  Siente dolor al [employer].  La orina tiene [place].  Comienza a devolver (vomita).  El dolor no mejora al [employer].  Siente Geophysical data processor.  El dolor [place].  ASEGRESE DE QUE:  Comprende estas instrucciones.  Controlar su afeccin.  Recibir ayuda de inmediato si no mejora o si empeora.  Esta informacin no tiene Marine scientist el consejo del mdico. Asegrese de hacerle al mdico cualquier pregunta que tenga. Document Released: 07/28/2010 Document Revised: 06/30/2013 Document Reviewed: 12/19/2012 Elsevier Interactive Patient Education  2017 [employer].

## 2017-04-02 NOTE — Op Note (Signed)
Please see the brief operative note for details

## 2017-04-02 NOTE — Interval H&P Note (Signed)
History and Physical Interval Note:  04/02/2017 7:41 AM  Brandy Frank  has presented today for surgery, with the diagnosis of Dysplasia of Cervix  The various methods of treatment have been discussed with the patient and family. After consideration of risks, benefits and other options for treatment, the patient has consented to  Procedure(s): CONIZATION CERVIX WITH BIOPSY(cold knife) (N/A) as a surgical intervention .  The patient's history has been reviewed, patient examined, no change in status, stable for surgery.  I have reviewed the patient's chart and labs.  Questions were answered to the patient's satisfaction.     [name]

## 2017-04-02 NOTE — Anesthesia Procedure Notes (Signed)
Procedure Name: LMA Insertion Date/Time: 04/02/2017 7:54 AM Performed by: Charmaine Downs Pre-anesthesia Checklist: Patient identified, Patient being monitored, Emergency Drugs available, Timeout performed and Suction available Patient Re-evaluated:Patient Re-evaluated prior to induction Oxygen Delivery Method: Circle System Utilized Preoxygenation: Pre-oxygenation with 100% oxygen Induction Type: IV induction Ventilation: Mask ventilation without difficulty LMA: LMA inserted LMA Size: 4.0 Number of attempts: 1 Placement Confirmation: positive ETCO2 and breath sounds checked- equal and bilateral Tube secured with: Tape Dental Injury: Teeth and Oropharynx as per pre-operative assessment

## 2017-04-02 NOTE — Transfer of Care (Signed)
Immediate Anesthesia Transfer of Care Note  Patient: Brandy Frank  Procedure(s) Performed: Procedure(s): CONIZATION CERVIX WITH BIOPSY(cold knife) (N/A)  Patient Location: PACU  Anesthesia Type:General  Level of Consciousness: awake and patient cooperative  Airway & Oxygen Therapy: Patient Spontanous Breathing and Patient connected to face mask oxygen  Post-op Assessment: Report given to RN, Post -op Vital signs reviewed and stable and Patient moving all extremities  Post vital signs: Reviewed and stable  Last Vitals:  Vitals:   04/02/17 0730 04/02/17 0740  BP: 123/80 (!) 123/56  Pulse:    Resp: 17 16  Temp:    SpO2: 97% 100%    Last Pain:  Vitals:   04/02/17 0713  TempSrc: Oral      Patients Stated Pain Goal: 6 (03/55/97 4163)  Complications: No apparent anesthesia complications

## 2017-04-02 NOTE — Anesthesia Postprocedure Evaluation (Signed)
Anesthesia Post Note  Patient: Brandy Frank  Procedure(s) Performed: Procedure(s) (LRB): CONIZATION CERVIX WITH BIOPSY(cold knife) (N/A)  Patient location during evaluation: PACU Anesthesia Type: General Level of consciousness: awake and patient cooperative Pain management: pain level controlled Vital Signs Assessment: post-procedure vital signs reviewed and stable Respiratory status: spontaneous breathing, nonlabored ventilation and respiratory function stable Cardiovascular status: blood pressure returned to baseline Postop Assessment: no apparent nausea or vomiting Anesthetic complications: no     Last Vitals:  Vitals:   04/02/17 0843 04/02/17 0845  BP: (P) 129/86 129/86  Pulse: (!) (P) 59 (!) 128  Resp: (P) 13 (!) 7  Temp: (P) 36.5 C   SpO2: (P) 100% 100%    Last Pain:  Vitals:   04/02/17 0713  TempSrc: Oral                 Rayen Dafoe J

## 2017-04-03 ENCOUNTER — Encounter (HOSPITAL_COMMUNITY): Payer: Self-pay | Admitting: Obstetrics and Gynecology

## 2017-04-10 ENCOUNTER — Encounter: Payer: BLUE CROSS/BLUE SHIELD | Admitting: Obstetrics and Gynecology

## 2017-04-11 ENCOUNTER — Ambulatory Visit (INDEPENDENT_AMBULATORY_CARE_PROVIDER_SITE_OTHER): Payer: BLUE CROSS/BLUE SHIELD | Admitting: Obstetrics and Gynecology

## 2017-04-11 ENCOUNTER — Encounter: Payer: Self-pay | Admitting: Obstetrics and Gynecology

## 2017-04-11 VITALS — BP 112/72 | HR 77 | Ht 59.0 in | Wt 127.6 lb

## 2017-04-11 DIAGNOSIS — Z09 Encounter for follow-up examination after completed treatment for conditions other than malignant neoplasm: Secondary | ICD-10-CM

## 2017-04-11 NOTE — Progress Notes (Signed)
   Subjective:  Brandy Frank is a 46 y.o. female now 1.5 weeks status post CONIZATION CERVIX WITH BIOPSY (cold knife).  Review of Systems Negative   Diet: normal   Bowel movements : normal.  The patient is not having any pain.  Objective:  There were no vitals taken for this visit. General:Well developed, well nourished.  No acute distress. Abdomen: Bowel sounds normal, soft, non-tender. Pelvic Exam:    External Genitalia:  Normal.    Vagina: Normal    Cervix: Normal    Uterus: Normal    Adnexa/Bimanual: Normal  Incision(s):   Healing well, no drainage, no erythema, no hernia, no swelling, no dehiscence,     Assessment:  Post-Op 1.4 weeks s/p CONIZATION CERVIX WITH BIOPSY(cold knife) .    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 Clear Margins Doing well postoperatively.   Plan:  1.Wound care discussed 2. current medications: Prescribe Metrogel 3. Activity restrictions: none 4. return to work: not applicable. 5. Follow up in 4 weeks.    By signing my name below, I, Izna Ahmed, attest that this documentation has been prepared under the direction and in the presence of [name], MD. Electronically Signed: Jabier Gauss, Medical Scribe. 04/11/17. 2:33 PM.  I personally performed the services described in this documentation, which was SCRIBED in my presence. The recorded information has been reviewed and considered accurate. It has been edited as necessary during review. [name], MD

## 2017-04-11 NOTE — Progress Notes (Signed)
Language line used for interpretation. Interpreter [id number] used.

## 2017-05-13 ENCOUNTER — Ambulatory Visit: Payer: BLUE CROSS/BLUE SHIELD | Admitting: Obstetrics and Gynecology

## 2017-05-13 ENCOUNTER — Encounter: Payer: Self-pay | Admitting: Obstetrics and Gynecology

## 2017-05-13 VITALS — BP 118/70 | HR 66 | Ht 59.0 in | Wt 128.4 lb

## 2017-05-13 DIAGNOSIS — Z09 Encounter for follow-up examination after completed treatment for conditions other than malignant neoplasm: Secondary | ICD-10-CM

## 2017-05-13 DIAGNOSIS — Z9889 Other specified postprocedural states: Secondary | ICD-10-CM

## 2017-05-13 MED ORDER — METRONIDAZOLE 0.75 % VA GEL: 1.0000 | Freq: Every day | VAGINAL | 1 refills | Status: AC

## 2017-05-13 NOTE — Progress Notes (Signed)
Patient ID: Brandy Frank, female   DOB: 09/24/1970, 46 y.o.   MRN: [id number]    Subjective:  Brandy Frank is a 46 y.o. female now 11 days status post Conization Cervix with Biopsy (cold knife).   Review of Systems Negative   Diet:   normal   Bowel movements : normal.  The patient is not having any pain.  Objective:  BP 118/70 (BP Location: Right Arm, Patient Position: Sitting, Cuff Size: Normal)   Pulse 66   Ht 4\' 11"  (1.499 m)   Wt 128 lb 6.4 oz (58.2 kg)   BMI 25.93 kg/m  General:Well developed, well nourished.  No acute distress. Abdomen: Bowel sounds normal, soft, non-tender. Pelvic Exam:    External Genitalia:  Normal.    Vagina: Normal    Cervix: Normal    Uterus: erythema, moderate leukorrhea     Adnexa/Bimanual: Normal  Incision(s):   Healing well, no drainage, no erythema, no hernia, no swelling, no dehiscence,     Assessment:  Post-Op 11 days s/p Conization Cervix with Biopsy (cold knife)   Healing well postoperatively.   Plan:  1.Wound care discussed  2. Rx Metrogel 3. Activity restrictions: no intercourse 4. return to work: 1-2 weeks. 5. Follow up in 1 month.   By signing my name below, I, Margit Banda, attest that this documentation has been prepared under the direction and in the presence of [name], MD. Electronically Signed: Margit Banda, Medical Scribe. 05/13/17. 3:37 PM.  I personally performed the services described in this documentation, which was SCRIBED in my presence. The recorded information has been reviewed and considered accurate. It has been edited as necessary during review. [name], MD

## 2017-05-23 DIAGNOSIS — K8020 Calculus of gallbladder without cholecystitis without obstruction: Secondary | ICD-10-CM | POA: Diagnosis not present

## 2017-05-23 DIAGNOSIS — Z79899 Other long term (current) drug therapy: Secondary | ICD-10-CM | POA: Diagnosis not present

## 2017-05-23 DIAGNOSIS — N1339 Other hydronephrosis: Secondary | ICD-10-CM | POA: Diagnosis not present

## 2017-05-23 DIAGNOSIS — R1011 Right upper quadrant pain: Secondary | ICD-10-CM | POA: Diagnosis not present

## 2017-05-27 DIAGNOSIS — D259 Leiomyoma of uterus, unspecified: Secondary | ICD-10-CM | POA: Diagnosis not present

## 2017-05-27 DIAGNOSIS — K429 Umbilical hernia without obstruction or gangrene: Secondary | ICD-10-CM | POA: Diagnosis not present

## 2017-05-27 DIAGNOSIS — Z79899 Other long term (current) drug therapy: Secondary | ICD-10-CM | POA: Diagnosis not present

## 2017-05-27 DIAGNOSIS — K828 Other specified diseases of gallbladder: Secondary | ICD-10-CM | POA: Diagnosis not present

## 2017-05-27 DIAGNOSIS — K8000 Calculus of gallbladder with acute cholecystitis without obstruction: Secondary | ICD-10-CM | POA: Diagnosis not present

## 2017-05-27 DIAGNOSIS — Z6831 Body mass index (BMI) 31.0-31.9, adult: Secondary | ICD-10-CM | POA: Diagnosis not present

## 2017-05-27 DIAGNOSIS — E039 Hypothyroidism, unspecified: Secondary | ICD-10-CM | POA: Diagnosis not present

## 2017-05-27 DIAGNOSIS — K810 Acute cholecystitis: Secondary | ICD-10-CM | POA: Diagnosis not present

## 2017-05-28 DIAGNOSIS — D259 Leiomyoma of uterus, unspecified: Secondary | ICD-10-CM | POA: Diagnosis not present

## 2017-05-28 DIAGNOSIS — K828 Other specified diseases of gallbladder: Secondary | ICD-10-CM | POA: Diagnosis not present

## 2017-05-28 DIAGNOSIS — K8000 Calculus of gallbladder with acute cholecystitis without obstruction: Secondary | ICD-10-CM | POA: Diagnosis not present

## 2017-05-28 DIAGNOSIS — E039 Hypothyroidism, unspecified: Secondary | ICD-10-CM | POA: Diagnosis not present

## 2017-05-28 DIAGNOSIS — Z79899 Other long term (current) drug therapy: Secondary | ICD-10-CM | POA: Diagnosis not present

## 2017-05-28 DIAGNOSIS — K810 Acute cholecystitis: Secondary | ICD-10-CM | POA: Diagnosis not present

## 2017-05-28 DIAGNOSIS — K429 Umbilical hernia without obstruction or gangrene: Secondary | ICD-10-CM | POA: Diagnosis not present

## 2017-05-29 DIAGNOSIS — E039 Hypothyroidism, unspecified: Secondary | ICD-10-CM | POA: Diagnosis not present

## 2017-05-29 DIAGNOSIS — K8000 Calculus of gallbladder with acute cholecystitis without obstruction: Secondary | ICD-10-CM | POA: Diagnosis not present

## 2017-05-29 DIAGNOSIS — K810 Acute cholecystitis: Secondary | ICD-10-CM | POA: Diagnosis not present

## 2017-05-29 DIAGNOSIS — Z79899 Other long term (current) drug therapy: Secondary | ICD-10-CM | POA: Diagnosis not present

## 2017-05-29 DIAGNOSIS — K828 Other specified diseases of gallbladder: Secondary | ICD-10-CM | POA: Diagnosis not present

## 2017-05-29 DIAGNOSIS — K429 Umbilical hernia without obstruction or gangrene: Secondary | ICD-10-CM | POA: Diagnosis not present

## 2017-05-29 DIAGNOSIS — D259 Leiomyoma of uterus, unspecified: Secondary | ICD-10-CM | POA: Diagnosis not present

## 2017-05-30 DIAGNOSIS — Z79899 Other long term (current) drug therapy: Secondary | ICD-10-CM | POA: Diagnosis not present

## 2017-05-30 DIAGNOSIS — K8000 Calculus of gallbladder with acute cholecystitis without obstruction: Secondary | ICD-10-CM | POA: Diagnosis not present

## 2017-05-30 DIAGNOSIS — E039 Hypothyroidism, unspecified: Secondary | ICD-10-CM | POA: Diagnosis not present

## 2017-05-30 DIAGNOSIS — K810 Acute cholecystitis: Secondary | ICD-10-CM | POA: Diagnosis not present

## 2017-05-30 DIAGNOSIS — K828 Other specified diseases of gallbladder: Secondary | ICD-10-CM | POA: Diagnosis not present

## 2017-05-30 DIAGNOSIS — D259 Leiomyoma of uterus, unspecified: Secondary | ICD-10-CM | POA: Diagnosis not present

## 2017-05-30 DIAGNOSIS — K429 Umbilical hernia without obstruction or gangrene: Secondary | ICD-10-CM | POA: Diagnosis not present

## 2017-06-12 ENCOUNTER — Encounter: Payer: BLUE CROSS/BLUE SHIELD | Admitting: Obstetrics and Gynecology

## 2017-06-23 HISTORY — PX: APPENDECTOMY: SHX54

## 2017-06-24 ENCOUNTER — Encounter: Payer: BLUE CROSS/BLUE SHIELD | Admitting: Obstetrics and Gynecology

## 2017-07-03 ENCOUNTER — Encounter: Payer: BLUE CROSS/BLUE SHIELD | Admitting: Obstetrics and Gynecology

## 2017-07-04 ENCOUNTER — Ambulatory Visit (INDEPENDENT_AMBULATORY_CARE_PROVIDER_SITE_OTHER): Payer: BLUE CROSS/BLUE SHIELD | Admitting: Obstetrics and Gynecology

## 2017-07-04 ENCOUNTER — Encounter: Payer: Self-pay | Admitting: Obstetrics and Gynecology

## 2017-07-04 VITALS — BP 110/70 | HR 75 | Ht 59.0 in | Wt 128.0 lb

## 2017-07-04 DIAGNOSIS — N871 Moderate cervical dysplasia: Secondary | ICD-10-CM

## 2017-07-04 NOTE — Progress Notes (Signed)
Patient ID: Brandy Frank, female   DOB: June 02, 1971, 46 y.o.   MRN: [id number]    Subjective:  Brandy Frank is a 46 y.o. female now 3 months status post conization cervix with biopsy (cold knife).   Review of Systems Negative    Diet:   normal   Bowel movements : normal.  The patient is not having any pain.  Objective:  BP 110/70 (BP Location: Left Arm, Patient Position: Sitting, Cuff Size: Normal)   Pulse 75   Ht 4\' 11"  (1.499 m)   Wt 128 lb (58.1 kg)   LMP 06/29/2017   BMI 25.85 kg/m  General:Well developed, well nourished.  No acute distress. Abdomen: Bowel sounds normal, soft, non-tender. Pelvic Exam:    External Genitalia:  Normal.    Vagina: Normal    Cervix: smooth, well healed s/p conization  No lesions, no bleeding    Uterus: Normal    Adnexa/Bimanual: Normal  Incision(s):   Healing well, no drainage, no erythema, no hernia, no swelling, no dehiscence,     Assessment:  Post-Op 3 months s/p conization cervix with biopsy (cold knife conization)    Doing well postoperatively.   Plan:  1.Wound care discussed   2. . current medications. 3. Activity restrictions: none 4. return to work: now. 5. Follow up in 1 year for PAP/PE. Annual exam with pap x 20 yrs.  By signing my name below, I, Margit Banda, attest that this documentation has been prepared under the direction and in the presence of [name], MD. Electronically Signed: Margit Banda, Medical Scribe. 07/04/17. 3:59 PM.  I personally performed the services described in this documentation, which was SCRIBED in my presence. The recorded information has been reviewed and considered accurate. It has been edited as necessary during review. [name], MD  '

## 2017-08-14 DIAGNOSIS — E785 Hyperlipidemia, unspecified: Secondary | ICD-10-CM | POA: Diagnosis not present

## 2017-08-14 DIAGNOSIS — E039 Hypothyroidism, unspecified: Secondary | ICD-10-CM | POA: Diagnosis not present

## 2017-11-15 DIAGNOSIS — Z3009 Encounter for other general counseling and advice on contraception: Secondary | ICD-10-CM | POA: Diagnosis not present

## 2017-11-15 DIAGNOSIS — Z9889 Other specified postprocedural states: Secondary | ICD-10-CM | POA: Diagnosis not present

## 2017-11-15 DIAGNOSIS — Z01419 Encounter for gynecological examination (general) (routine) without abnormal findings: Secondary | ICD-10-CM | POA: Diagnosis not present

## 2017-12-22 DIAGNOSIS — Z79899 Other long term (current) drug therapy: Secondary | ICD-10-CM | POA: Diagnosis not present

## 2017-12-22 DIAGNOSIS — K1379 Other lesions of oral mucosa: Secondary | ICD-10-CM | POA: Diagnosis not present

## 2017-12-22 DIAGNOSIS — K120 Recurrent oral aphthae: Secondary | ICD-10-CM | POA: Diagnosis not present

## 2017-12-26 DIAGNOSIS — N390 Urinary tract infection, site not specified: Secondary | ICD-10-CM | POA: Diagnosis not present

## 2017-12-26 DIAGNOSIS — N760 Acute vaginitis: Secondary | ICD-10-CM | POA: Diagnosis not present

## 2018-04-16 DIAGNOSIS — N912 Amenorrhea, unspecified: Secondary | ICD-10-CM | POA: Diagnosis not present

## 2018-04-16 DIAGNOSIS — E039 Hypothyroidism, unspecified: Secondary | ICD-10-CM | POA: Diagnosis not present

## 2018-04-16 DIAGNOSIS — Z23 Encounter for immunization: Secondary | ICD-10-CM | POA: Diagnosis not present

## 2018-04-16 DIAGNOSIS — E785 Hyperlipidemia, unspecified: Secondary | ICD-10-CM | POA: Diagnosis not present

## 2018-06-24 DIAGNOSIS — R509 Fever, unspecified: Secondary | ICD-10-CM | POA: Diagnosis not present

## 2018-06-24 DIAGNOSIS — B349 Viral infection, unspecified: Secondary | ICD-10-CM | POA: Diagnosis not present

## 2018-08-06 DIAGNOSIS — L218 Other seborrheic dermatitis: Secondary | ICD-10-CM | POA: Diagnosis not present

## 2018-10-02 DIAGNOSIS — E785 Hyperlipidemia, unspecified: Secondary | ICD-10-CM | POA: Diagnosis not present

## 2018-10-02 DIAGNOSIS — E039 Hypothyroidism, unspecified: Secondary | ICD-10-CM | POA: Diagnosis not present

## 2018-10-02 DIAGNOSIS — R300 Dysuria: Secondary | ICD-10-CM | POA: Diagnosis not present

## 2018-10-02 DIAGNOSIS — N390 Urinary tract infection, site not specified: Secondary | ICD-10-CM | POA: Diagnosis not present

## 2018-12-04 DIAGNOSIS — N760 Acute vaginitis: Secondary | ICD-10-CM | POA: Diagnosis not present

## 2018-12-04 DIAGNOSIS — Z3009 Encounter for other general counseling and advice on contraception: Secondary | ICD-10-CM | POA: Diagnosis not present

## 2018-12-04 DIAGNOSIS — N926 Irregular menstruation, unspecified: Secondary | ICD-10-CM | POA: Diagnosis not present

## 2018-12-04 DIAGNOSIS — Z01419 Encounter for gynecological examination (general) (routine) without abnormal findings: Secondary | ICD-10-CM | POA: Diagnosis not present

## 2018-12-05 ENCOUNTER — Other Ambulatory Visit: Payer: Self-pay | Admitting: Nurse Practitioner

## 2018-12-05 DIAGNOSIS — N921 Excessive and frequent menstruation with irregular cycle: Secondary | ICD-10-CM

## 2018-12-05 DIAGNOSIS — N926 Irregular menstruation, unspecified: Secondary | ICD-10-CM

## 2018-12-18 ENCOUNTER — Ambulatory Visit
Admission: RE | Admit: 2018-12-18 | Discharge: 2018-12-18 | Disposition: A | Discharge status: Home / Self Care | Payer: BLUE CROSS/BLUE SHIELD | Source: Ambulatory Visit | Attending: Nurse Practitioner | Admitting: Nurse Practitioner

## 2018-12-18 DIAGNOSIS — N926 Irregular menstruation, unspecified: Secondary | ICD-10-CM

## 2018-12-18 DIAGNOSIS — H1010 Acute atopic conjunctivitis, unspecified eye: Secondary | ICD-10-CM | POA: Diagnosis not present

## 2018-12-18 DIAGNOSIS — N858 Other specified noninflammatory disorders of uterus: Secondary | ICD-10-CM | POA: Diagnosis not present

## 2018-12-18 DIAGNOSIS — E039 Hypothyroidism, unspecified: Secondary | ICD-10-CM | POA: Diagnosis not present

## 2018-12-18 DIAGNOSIS — E559 Vitamin D deficiency, unspecified: Secondary | ICD-10-CM | POA: Diagnosis not present

## 2018-12-18 DIAGNOSIS — E785 Hyperlipidemia, unspecified: Secondary | ICD-10-CM | POA: Diagnosis not present

## 2018-12-18 DIAGNOSIS — N921 Excessive and frequent menstruation with irregular cycle: Secondary | ICD-10-CM

## 2020-06-24 IMAGING — US US PELVIS COMPLETE WITH TRANSVAGINAL
1 series · 13 of 25 positions shown · non-contrast
Comparison: None

CLINICAL DATA: Irregular bleeding

EXAM:
TRANSABDOMINAL AND TRANSVAGINAL ULTRASOUND OF PELVIS
TECHNIQUE: Both transabdominal and transvaginal ultrasound examinations of the
pelvis were performed. Transabdominal technique was performed for
global imaging of the pelvis including uterus, ovaries, adnexal
regions, and pelvic cul-de-sac. It was necessary to proceed with
endovaginal exam following the transabdominal exam to visualize the
uterus endometrium ovaries.

[Series 1: us pelvis complete with transvaginal · 0.23mm/px · 13 of 48 slices shown]
[im 1/48]
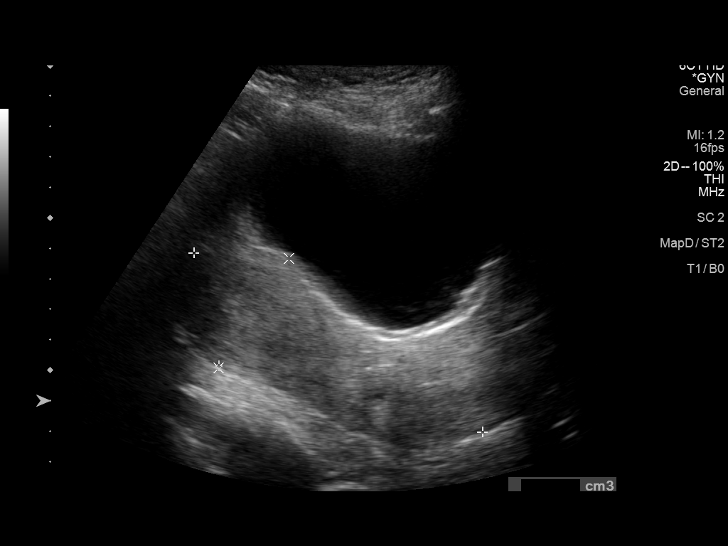
[im 4/48]
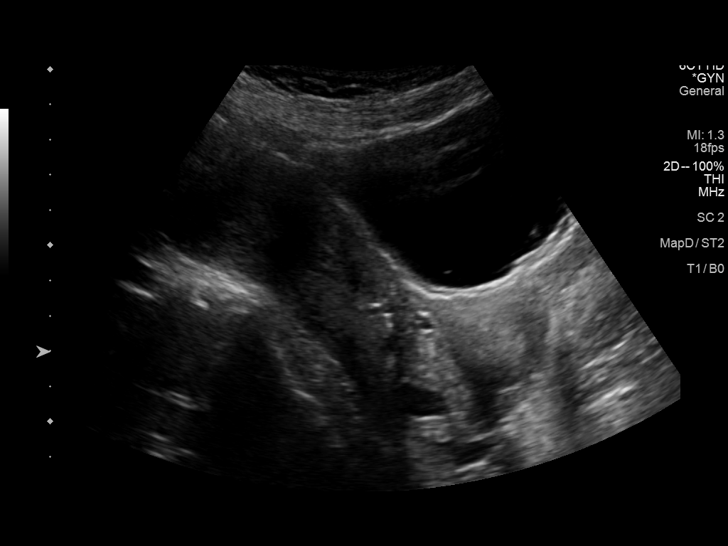
[im 8/48]
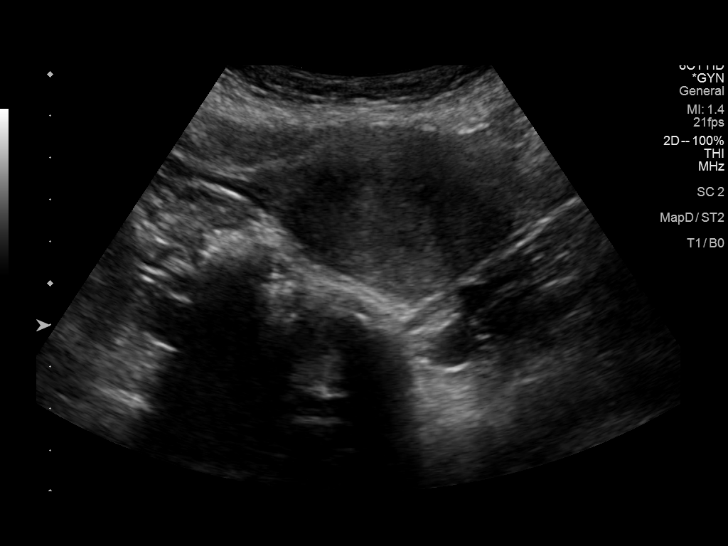
[im 12/48]
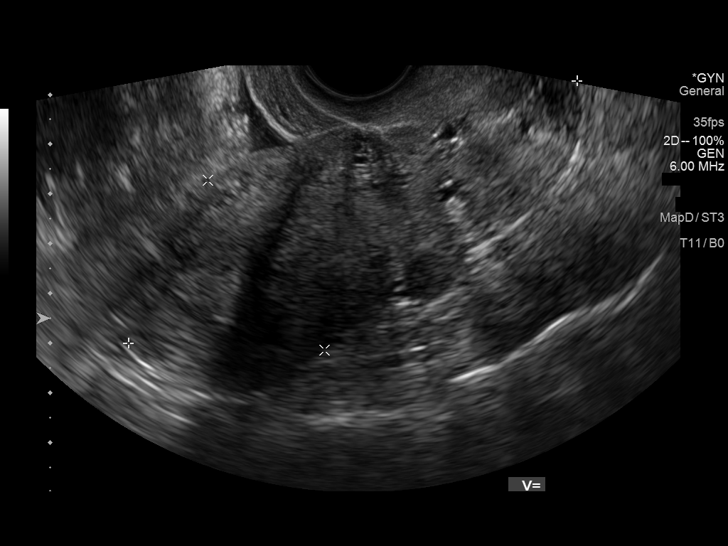
[im 16/48]
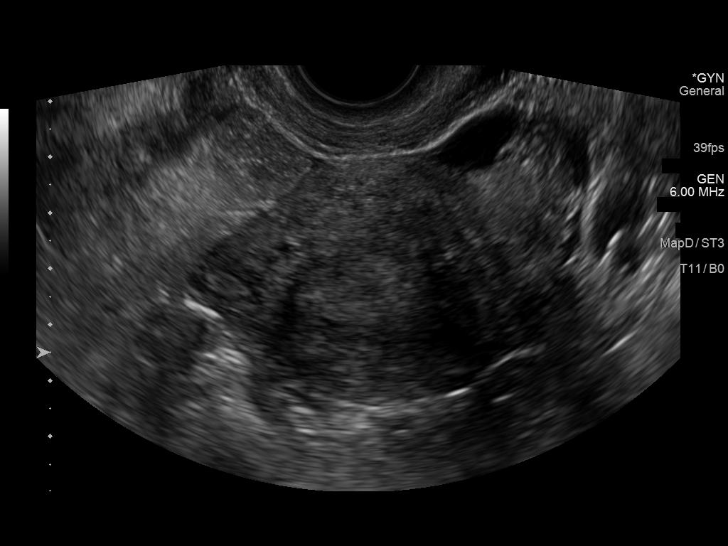
[im 20/48]
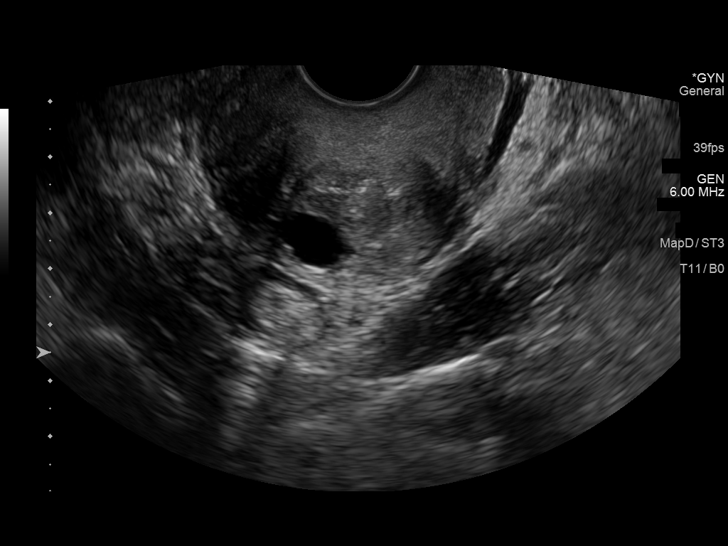
[im 24/48]
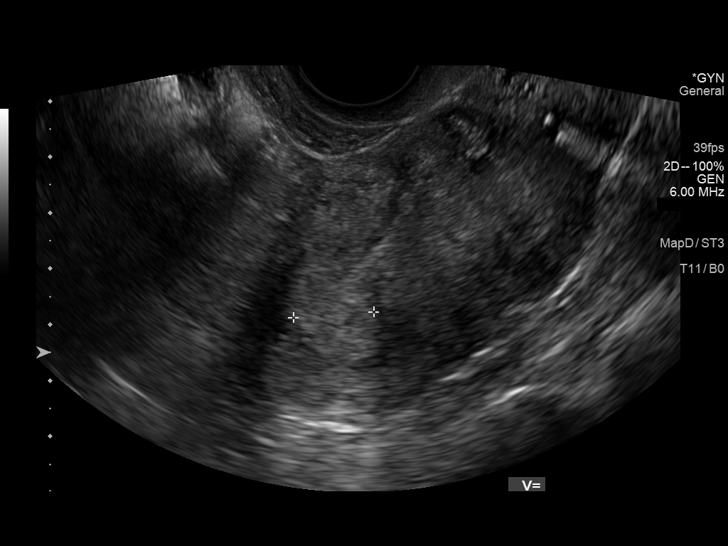
[im 28/48]
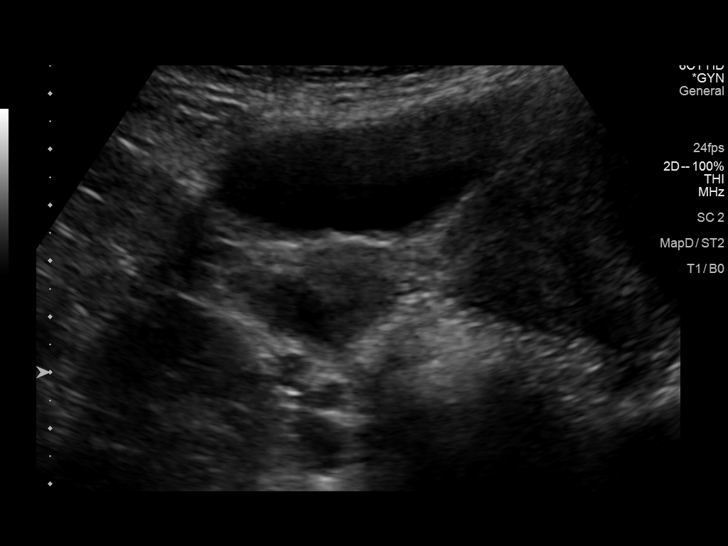
[im 32/48]
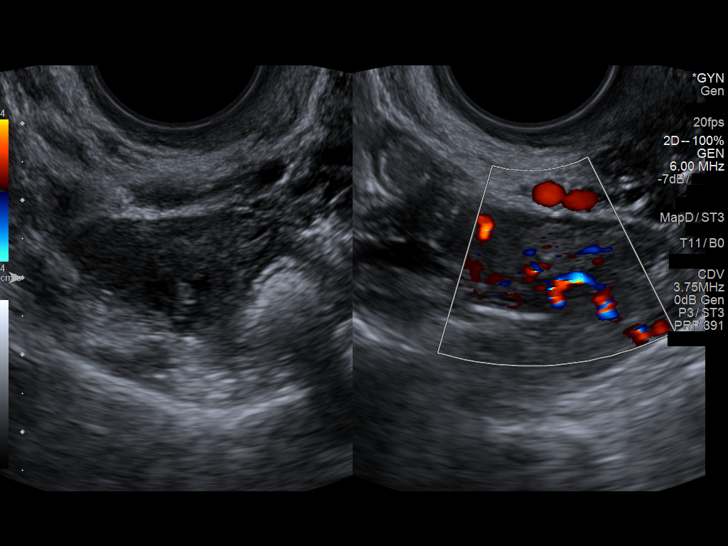
[im 36/48]
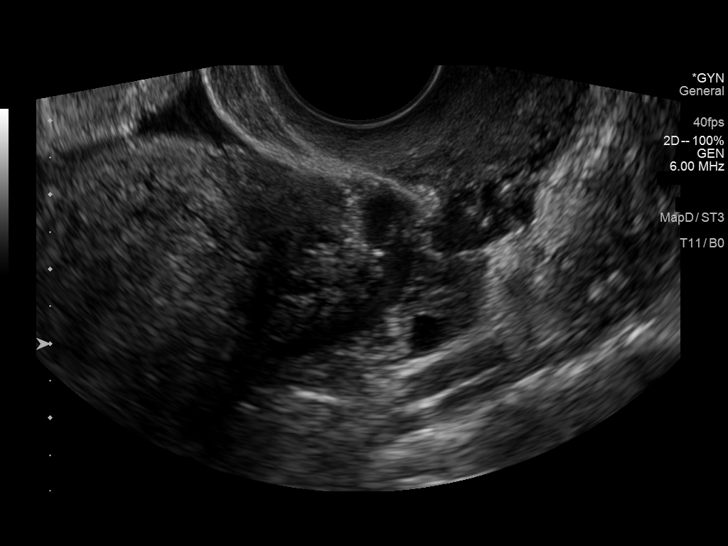
[im 40/48]
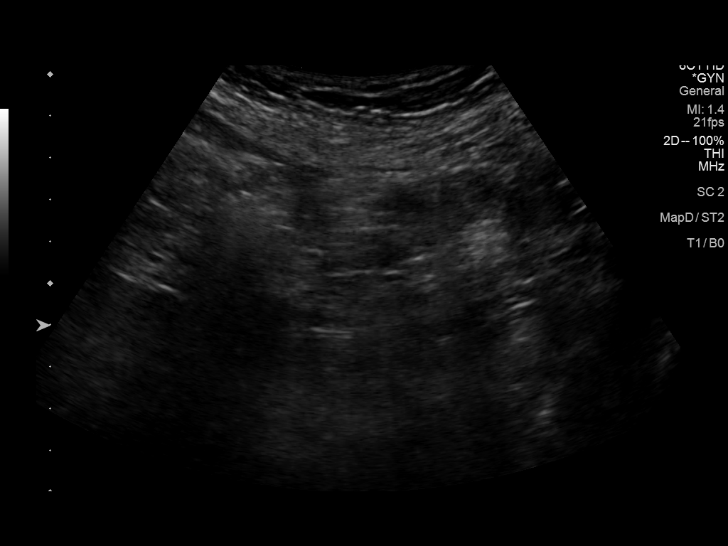
[im 44/48]
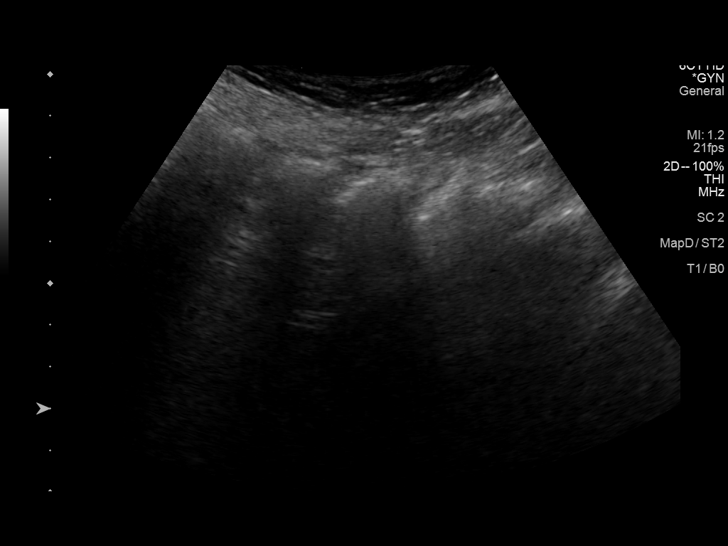
[im 48/48]
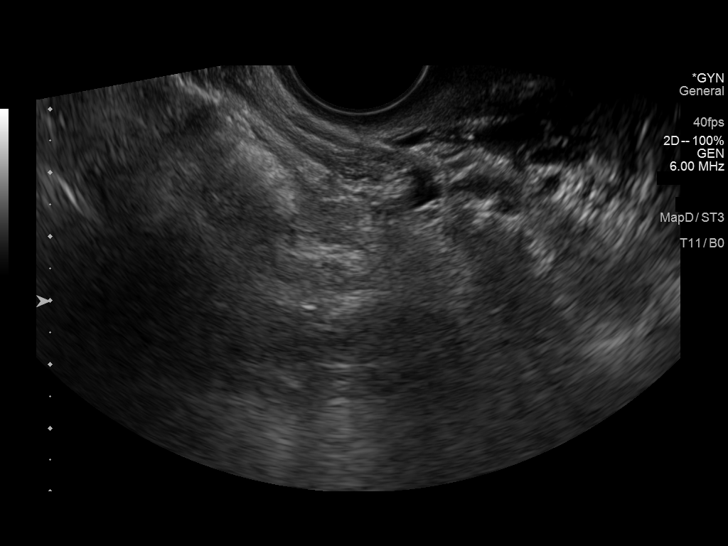

[13 of 25 positions shown; findings below may reference images not displayed]

FINDINGS: Uterus

Measurements: 11.2 x 4.3 x 4.9 cm = volume: 123 mL. Heterogeneous
myometrial echotexture without focal mass.

Endometrium

Thickness: 14.4 mm.  No focal abnormality visualized.

Right ovary

Measurements: 3 x 1.5 x 2.3 cm = volume: 5.4 mL. Normal
appearance/no adnexal mass.

Left ovary

Measurements: 2.2 x 1.1 x 1.8 cm = volume: 2.2 mL. Normal
appearance/no adnexal mass.

Other findings

No abnormal free fluid.
IMPRESSION: 1. Endometrial thickness of 14.4 mm. If bleeding remains
unresponsive to hormonal or medical therapy, sonohysterogram should
be considered for focal lesion work-up. (Ref: Radiological
Reasoning: Algorithmic Workup of Abnormal Vaginal Bleeding with
Endovaginal Sonography and Sonohysterography. AJR [id number]; 191:[id number])
2. Enlarged appearing uterus without focal myometrial mass. Diffuse
inhomogeneous echotexture of the uterus, findings are nonspecific
but could be seen in the setting of adenomyosis.

## 2022-12-11 ENCOUNTER — Telehealth: Payer: Self-pay

## 2022-12-11 NOTE — Telephone Encounter (Signed)
Telephoned patient using interpreter#[phone]. Left voice message with [facility] (scholarship) contact information.

## 2022-12-13 ENCOUNTER — Other Ambulatory Visit (HOSPITAL_COMMUNITY): Payer: Self-pay | Admitting: Obstetrics and Gynecology

## 2022-12-13 DIAGNOSIS — N6322 Unspecified lump in the left breast, upper inner quadrant: Secondary | ICD-10-CM

## 2022-12-13 DIAGNOSIS — N6325 Unspecified lump in the left breast, overlapping quadrants: Secondary | ICD-10-CM

## 2022-12-21 ENCOUNTER — Inpatient Hospital Stay: Payer: BC Managed Care – PPO

## 2023-01-01 ENCOUNTER — Ambulatory Visit (HOSPITAL_COMMUNITY): Payer: BC Managed Care – PPO

## 2023-01-01 ENCOUNTER — Encounter (HOSPITAL_COMMUNITY): Payer: BC Managed Care – PPO

## 2023-01-18 ENCOUNTER — Inpatient Hospital Stay: Payer: BC Managed Care – PPO

## 2023-01-22 ENCOUNTER — Encounter (HOSPITAL_COMMUNITY): Payer: BC Managed Care – PPO

## 2023-01-22 ENCOUNTER — Ambulatory Visit (HOSPITAL_COMMUNITY): Payer: BC Managed Care – PPO

## 2023-01-29 ENCOUNTER — Encounter (HOSPITAL_COMMUNITY): Payer: BC Managed Care – PPO

## 2023-01-29 ENCOUNTER — Other Ambulatory Visit (HOSPITAL_COMMUNITY): Payer: BC Managed Care – PPO

## 2023-01-30 NOTE — Congregational Nurse Program (Signed)
Received pt packet to review health risk stratisfication by [facility] [place] enrollment team.    Pt has been dual enrolled at the [facility]) and is connected with PCP with a last follow up visit 7.16.24 as it relates to BP evaluation anxiety/depressive disorder and UTI   Pt risk level has been modified from Low to Moderate due to HTN (hypertension dx) with a reported BP of 118/70 (WNL) on 7.16.24 with meds at this time.      Pt has pharmacy option of [facility] and/or [facility] in [place].  Pt  does takes meds for other conditions as well outside into BP meds  No social determinants of health (SODH) at this time identified during the pt PCP visit at [facility]

## 2023-03-22 ENCOUNTER — Inpatient Hospital Stay: Payer: BC Managed Care – PPO | Attending: Hematology | Admitting: Hematology and Oncology

## 2023-03-22 VITALS — BP 140/84 | Wt 127.0 lb

## 2023-03-22 DIAGNOSIS — N6320 Unspecified lump in the left breast, unspecified quadrant: Secondary | ICD-10-CM

## 2023-03-22 NOTE — Patient Instructions (Addendum)
Taught Brandy Frank about self breast awareness and gave educational materials to take home. Patient did not need a Pap smear today due to last Pap smear was in 12/2022 per patient.  Let her know [facility] will cover Pap smears every 5 years unless has a history of abnormal Pap smears. Referred patient to the [name] for diagnostic mammogram. Appointment scheduled for 03/26/2023 . Patient aware of appointment and will be there. Let patient know will follow up with her within the next couple weeks with results. Brandy Frank verbalized understanding.  [name], NP 9:23 AM

## 2023-03-22 NOTE — Progress Notes (Signed)
[name] is a 52 y.o. female who presents to [facility] clinic today with complaint of left breast mass.    Pap Smear: Pap not smear completed today. Last Pap smear was 12/2022 at [facility] clinic and was normal. [name] Last Pap smear result is not available in Epic.   Physical exam: Breasts Breasts symmetrical. No skin abnormalities bilateral breasts. No nipple retraction bilateral breasts. No nipple discharge bilateral breasts. No lymphadenopathy. No lumps palpated right breasts. Left breast with mass noted at 12 - 1 o'clock.       Pelvic/Bimanual Pap is not indicated today    Smoking History: Patient has never smoked and was not referred to quit line.    Patient Navigation: Patient education provided. Access to services provided for patient through [facility] program. Marta Col (CAP) interpreter provided. No transportation provided   Colorectal Cancer Screening: Per patient has never had colonoscopy completed No complaints today. FIT test completed [facility] 12/10/22   Breast and Cervical Cancer Risk Assessment: Patient does not have family history of breast cancer, known genetic mutations, or radiation treatment to the chest before age 69. Patient does not have history of cervical dysplasia, immunocompromised, or DES exposure in-utero.  Risk Scores as of Encounter on 03/22/2023     Brandy Frank           5-year 0.95%   Lifetime 8.82%   [name], [name] [name]         [name], [name]        A: [facility] exam without pap smear Complaint of left breast mass as noted on exam.   P: Referred patient to the [name] for a diagnostic mammogram. Appointment scheduled  03/26/2023.  [name], NP 03/22/2023 9:51 AM

## 2023-03-26 ENCOUNTER — Ambulatory Visit (HOSPITAL_COMMUNITY)
Admission: RE | Admit: 2023-03-26 | Discharge: 2023-03-26 | Disposition: A | Discharge status: Home / Self Care | Payer: Self-pay | Source: Ambulatory Visit | Attending: Obstetrics and Gynecology | Admitting: Obstetrics and Gynecology

## 2023-03-26 DIAGNOSIS — N6325 Unspecified lump in the left breast, overlapping quadrants: Secondary | ICD-10-CM

## 2023-03-26 DIAGNOSIS — N6322 Unspecified lump in the left breast, upper inner quadrant: Secondary | ICD-10-CM
